# Patient Record
Sex: Male | Born: 1990 | Race: White | Hispanic: No | Marital: Married | Smoking: Never smoker
Health system: Southern US, Community
[De-identification: ages and names within clinical notes are randomized; demographics above are authoritative.]

---

## 2014-01-14 ENCOUNTER — Ambulatory Visit (INDEPENDENT_AMBULATORY_CARE_PROVIDER_SITE_OTHER): Payer: PRIVATE HEALTH INSURANCE | Admitting: Urology

## 2014-01-14 DIAGNOSIS — R3129 Other microscopic hematuria: Secondary | ICD-10-CM

## 2014-04-15 ENCOUNTER — Ambulatory Visit: Payer: PRIVATE HEALTH INSURANCE | Admitting: Urology

## 2014-08-03 ENCOUNTER — Telehealth: Payer: Self-pay | Admitting: Family Medicine

## 2014-08-03 NOTE — Telephone Encounter (Signed)
Patient is not on any medications- [facility] appt give for 10/2

## 2014-08-26 ENCOUNTER — Ambulatory Visit: Payer: Self-pay | Admitting: Family Medicine

## 2014-10-14 ENCOUNTER — Encounter: Payer: Self-pay | Admitting: Family Medicine

## 2014-10-14 ENCOUNTER — Encounter (INDEPENDENT_AMBULATORY_CARE_PROVIDER_SITE_OTHER): Payer: Self-pay

## 2014-10-14 ENCOUNTER — Ambulatory Visit (INDEPENDENT_AMBULATORY_CARE_PROVIDER_SITE_OTHER): Payer: 59 | Admitting: Family Medicine

## 2014-10-14 VITALS — BP 99/66 | HR 63 | Temp 98.1°F | Ht 67.5 in | Wt 215.6 lb

## 2014-10-14 DIAGNOSIS — Z0000 Encounter for general adult medical examination without abnormal findings: Secondary | ICD-10-CM

## 2014-10-14 NOTE — Progress Notes (Signed)
   Subjective:    Patient ID: Joel Bridges, male    DOB: 07/10/1991, 23 y.o.   MRN: [id number]  HPI 23 year old young man here for a physical at the request of his health insurance company. He has no problems issues. Family history is negative for chronic disease.    Review of Systems  Constitutional: Negative.   HENT: Negative.   Eyes: Negative.   Respiratory: Negative.  Negative for shortness of breath.   Cardiovascular: Negative.  Negative for chest pain and leg swelling.  Gastrointestinal: Negative.   Genitourinary: Negative.   Musculoskeletal: Negative.   Skin: Negative.   Neurological: Negative.   Psychiatric/Behavioral: Negative.   All other systems reviewed and are negative.      Objective:   Physical Exam  Constitutional: He is oriented to person, place, and time. He appears well-developed and well-nourished.  HENT:  Head: Normocephalic.  Right Ear: External ear normal.  Left Ear: External ear normal.  Nose: Nose normal.  Mouth/Throat: Oropharynx is clear and moist.  Eyes: Conjunctivae and EOM are normal. Pupils are equal, round, and reactive to light.  Neck: Normal range of motion. Neck supple.  Cardiovascular: Normal rate, regular rhythm, normal heart sounds and intact distal pulses.   Pulmonary/Chest: Effort normal and breath sounds normal.  Abdominal: Soft. Bowel sounds are normal.  Musculoskeletal: Normal range of motion.  Neurological: He is alert and oriented to person, place, and time.  Skin: Skin is warm and dry.  Psychiatric: He has a normal mood and affect. His behavior is normal. Judgment and thought content normal.    BP 99/66 mmHg  Pulse 63  Temp(Src) 98.1 F (36.7 C) (Oral)  Ht 5' 7.5" (1.715 m)  Wt 215 lb 9.6 oz (97.796 kg)  BMI 33.25 kg/m2      Assessment & Plan:  1. Routine general medical examination at a health care facility Normal exam but cautioned re waist circumference at his age - BMP8+EGFR  Wardell Honour MD - Lipid  panel

## 2014-10-14 NOTE — Patient Instructions (Signed)

## 2014-10-15 LAB — LIPID PANEL
Chol/HDL Ratio: 3.4 ratio units (ref 0.0–5.0)
Cholesterol, Total: 172 mg/dL (ref 100–199)
HDL: 51 mg/dL (ref >39)
LDL Calculated: 94 mg/dL (ref 0–99)
Triglycerides: 137 mg/dL (ref 0–149)
VLDL Cholesterol Cal: 27 mg/dL (ref 5–40)

## 2014-10-15 LAB — BMP8+EGFR
BUN/Creatinine Ratio: 18 (ref 8–19)
BUN: 15 mg/dL (ref 6–20)
CO2: 24 mmol/L (ref 18–29)
Calcium: 9.8 mg/dL (ref 8.7–10.2)
Chloride: 99 mmol/L (ref 97–108)
Creatinine, Ser: 0.84 mg/dL (ref 0.76–1.27)
GFR calc Af Amer: 143 mL/min/{1.73_m2} (ref >59)
GFR calc non Af Amer: 123 mL/min/{1.73_m2} (ref >59)
Glucose: 85 mg/dL (ref 65–99)
Potassium: 4.5 mmol/L (ref 3.5–5.2)
Sodium: 139 mmol/L (ref 134–144)

## 2014-10-17 ENCOUNTER — Telehealth: Payer: Self-pay | Admitting: Family Medicine

## 2014-10-17 NOTE — Telephone Encounter (Signed)
-----   Message from Frederica KusterStephen M Miller, MD sent at 10/17/2014  7:55 AM EST ----- Blood chemistries and lipids are all normal

## 2014-10-17 NOTE — Telephone Encounter (Signed)
Letter sent with results; No vm available 

## 2015-09-08 ENCOUNTER — Encounter: Payer: Self-pay | Admitting: Family Medicine

## 2015-09-08 ENCOUNTER — Ambulatory Visit (INDEPENDENT_AMBULATORY_CARE_PROVIDER_SITE_OTHER): Payer: BLUE CROSS/BLUE SHIELD | Admitting: Family Medicine

## 2015-09-08 VITALS — BP 113/78 | HR 96 | Temp 97.3°F | Ht 67.5 in | Wt 235.2 lb

## 2015-09-08 DIAGNOSIS — Z0000 Encounter for general adult medical examination without abnormal findings: Secondary | ICD-10-CM

## 2015-09-08 NOTE — Progress Notes (Signed)
Subjective:  Patient ID: Fredrik Rigger, male    DOB: 02-13-1991  Age: 24 y.o. MRN: [id number]  CC: Annual Exam   HPI Tahj Njoku presents for CPE   History Harout has no past medical history on file.   He has no past surgical history on file.   His family history is not on file.He reports that he has never smoked. He does not have any smokeless tobacco history on file. He reports that he does not drink alcohol or use illicit drugs.  No outpatient prescriptions prior to visit.   No facility-administered medications prior to visit.    ROS Review of Systems  Constitutional: Negative for fever, chills and diaphoresis.  HENT: Negative for congestion, rhinorrhea and sore throat.   Respiratory: Negative for cough, shortness of breath and wheezing.   Cardiovascular: Negative for chest pain.  Gastrointestinal: Negative for nausea, vomiting, abdominal pain, diarrhea, constipation and abdominal distention.  Genitourinary: Negative for dysuria and frequency.  Musculoskeletal: Negative for joint swelling and arthralgias.  Skin: Negative for rash.  Neurological: Negative for headaches.    Objective:  BP 113/78 mmHg  Pulse 96  Temp(Src) 97.3 F (36.3 C) (Oral)  Ht 5' 7.5" (1.715 m)  Wt 235 lb 3.2 oz (106.686 kg)  BMI 36.27 kg/m2  BP Readings from Last 3 Encounters:  09/08/15 113/78  10/14/14 99/66    Wt Readings from Last 3 Encounters:  09/08/15 235 lb 3.2 oz (106.686 kg)  10/14/14 215 lb 9.6 oz (97.796 kg)     Physical Exam  Constitutional: He is oriented to person, place, and time. He appears well-developed and well-nourished. No distress.  HENT:  Head: Normocephalic and atraumatic.  Right Ear: External ear normal.  Left Ear: External ear normal.  Nose: Nose normal.  Mouth/Throat: Oropharynx is clear and moist.  Eyes: Conjunctivae and EOM are normal. Pupils are equal, round, and reactive to light.  Neck: Normal range of motion. Neck supple. No thyromegaly  present.  Cardiovascular: Normal rate, regular rhythm and normal heart sounds.   No murmur heard. Pulmonary/Chest: Effort normal and breath sounds normal. No respiratory distress. He has no wheezes. He has no rales.  Abdominal: Soft. Bowel sounds are normal. He exhibits no distension. There is no tenderness.  Lymphadenopathy:    He has no cervical adenopathy.  Neurological: He is alert and oriented to person, place, and time. He has normal reflexes.  Skin: Skin is warm and dry.  Psychiatric: He has a normal mood and affect. His behavior is normal. Judgment and thought content normal.    No results found for: HGBA1C  Lab Results  Component Value Date   GLUCOSE 85 10/14/2014   CHOL 172 10/14/2014   TRIG 137 10/14/2014   HDL 51 10/14/2014   LDLCALC 94 10/14/2014   NA 139 10/14/2014   K 4.5 10/14/2014   CL 99 10/14/2014   CREATININE 0.84 10/14/2014   BUN 15 10/14/2014   CO2 24 10/14/2014    Patient was never admitted.  Assessment & Plan:   Jhett was seen today for annual exam.  Diagnoses and all orders for this visit:  Routine general medical examination at a health care facility -     CBC with Differential/Platelet -     CMP14+EGFR -     Lipid panel -     TSH -     Vit D  25 hydroxy (rtn osteoporosis monitoring)   Mr. Ricketson does not currently have medications on file.  No orders of  the defined types were placed in this encounter.   Patient was counseled on appropriate diet. Low carbohydrate, low sodium, high protein approach. WEight loss to goal of 170 lb. Regular exercise benefit with mix of cardio and resistance training was reviewed. Reminded to wear seat belt when driving or a passenger. Alcohol and drug avoidance reviewed. Safe sex discussed. Follow-up: No Follow-up on file.  Claretta Fraise, M.D.

## 2015-09-09 ENCOUNTER — Other Ambulatory Visit: Payer: BLUE CROSS/BLUE SHIELD

## 2015-09-11 ENCOUNTER — Other Ambulatory Visit: Payer: Self-pay | Admitting: Family Medicine

## 2015-09-11 LAB — CMP14+EGFR
ALT: 23 IU/L (ref 0–44)
AST: 17 IU/L (ref 0–40)
Albumin/Globulin Ratio: 2 (ref 1.1–2.5)
Albumin: 4.7 g/dL (ref 3.5–5.5)
Alkaline Phosphatase: 55 IU/L (ref 39–117)
BUN/Creatinine Ratio: 18 (ref 8–19)
BUN: 16 mg/dL (ref 6–20)
Bilirubin Total: 0.3 mg/dL (ref 0.0–1.2)
CO2: 21 mmol/L (ref 18–29)
Calcium: 9.6 mg/dL (ref 8.7–10.2)
Chloride: 99 mmol/L (ref 97–108)
Creatinine, Ser: 0.87 mg/dL (ref 0.76–1.27)
GFR calc Af Amer: 140 mL/min/{1.73_m2} (ref >59)
GFR calc non Af Amer: 121 mL/min/{1.73_m2} (ref >59)
Globulin, Total: 2.4 g/dL (ref 1.5–4.5)
Glucose: 86 mg/dL (ref 65–99)
Potassium: 4.8 mmol/L (ref 3.5–5.2)
Sodium: 140 mmol/L (ref 134–144)
Total Protein: 7.1 g/dL (ref 6.0–8.5)

## 2015-09-11 LAB — CBC WITH DIFFERENTIAL/PLATELET
Basophils Absolute: 0 10*3/uL (ref 0.0–0.2)
Basos: 1 %
EOS (ABSOLUTE): 0.5 10*3/uL — ABNORMAL HIGH (ref 0.0–0.4)
Eos: 7 %
Hematocrit: 44.1 % (ref 37.5–51.0)
Hemoglobin: 14.5 g/dL (ref 12.6–17.7)
Immature Grans (Abs): 0 10*3/uL (ref 0.0–0.1)
Immature Granulocytes: 0 %
Lymphocytes Absolute: 2.4 10*3/uL (ref 0.7–3.1)
Lymphs: 35 %
MCH: 29.5 pg (ref 26.6–33.0)
MCHC: 32.9 g/dL (ref 31.5–35.7)
MCV: 90 fL (ref 79–97)
Monocytes Absolute: 0.5 10*3/uL (ref 0.1–0.9)
Monocytes: 7 %
Neutrophils Absolute: 3.5 10*3/uL (ref 1.4–7.0)
Neutrophils: 50 %
Platelets: 333 10*3/uL (ref 150–379)
RBC: 4.92 x10E6/uL (ref 4.14–5.80)
RDW: 12.4 % (ref 12.3–15.4)
WBC: 7 10*3/uL (ref 3.4–10.8)

## 2015-09-11 LAB — TSH: TSH: 1.5 u[IU]/mL (ref 0.450–4.500)

## 2015-09-11 LAB — LIPID PANEL
Chol/HDL Ratio: 3.8 ratio units (ref 0.0–5.0)
Cholesterol, Total: 212 mg/dL — ABNORMAL HIGH (ref 100–199)
HDL: 56 mg/dL (ref >39)
LDL Calculated: 122 mg/dL — ABNORMAL HIGH (ref 0–99)
Triglycerides: 171 mg/dL — ABNORMAL HIGH (ref 0–149)
VLDL Cholesterol Cal: 34 mg/dL (ref 5–40)

## 2015-09-11 LAB — VITAMIN D 25 HYDROXY (VIT D DEFICIENCY, FRACTURES): Vit D, 25-Hydroxy: 21.5 ng/mL — ABNORMAL LOW (ref 30.0–100.0)

## 2015-09-11 MED ORDER — VITAMIN D (ERGOCALCIFEROL) 1.25 MG (50000 UNIT) PO CAPS
50000.0000 [IU] | ORAL_CAPSULE | ORAL | Status: DC
Start: 2015-09-11 — End: 2016-09-13

## 2016-09-13 ENCOUNTER — Ambulatory Visit (INDEPENDENT_AMBULATORY_CARE_PROVIDER_SITE_OTHER): Payer: BLUE CROSS/BLUE SHIELD | Admitting: Pediatrics

## 2016-09-13 ENCOUNTER — Encounter: Payer: Self-pay | Admitting: Pediatrics

## 2016-09-13 VITALS — BP 121/84 | HR 80 | Temp 98.4°F | Ht 67.5 in | Wt 230.4 lb

## 2016-09-13 DIAGNOSIS — S4992XA Unspecified injury of left shoulder and upper arm, initial encounter: Secondary | ICD-10-CM

## 2016-09-13 NOTE — Progress Notes (Signed)
  Subjective:   Patient ID: Joel Bridges, male    DOB: 10-31-1991, 25 y.o.   MRN: [id number] CC: numbness in left arm  HPI: Joel FloorRandall Brunn is a 25 y.o. male presenting for numbness in left arm  Was welding pipe three days ago Felt something give in his shoulder Now not able to raise L arm completely over his head Not able to make a biceps muscle with that arm Works as a Psychologist, occupationalwelder Some discomfort, not much pain Normal sensation hands, forearms Happened 2 days ago at work Had a harder time using L arm after that, minimal pain at time of injury  Relevant past medical, surgical, family and social history reviewed. Allergies and medications reviewed and updated. History  Smoking Status  . Never Smoker  Smokeless Tobacco  . Never Used   ROS: Per HPI   Objective:    BP (!) 146/89   Pulse 80   Temp 98.4 F (36.9 C) (Oral)   Ht 5' 7.5" (1.715 m)   Wt 230 lb 6.4 oz (104.5 kg)   BMI 35.55 kg/m   Wt Readings from Last 3 Encounters:  09/13/16 230 lb 6.4 oz (104.5 kg)  09/08/15 235 lb 3.2 oz (106.7 kg)  10/14/14 215 lb 9.6 oz (97.8 kg)    Gen: NAD, alert, cooperative with exam, NCAT EYES: EOMI, no conjunctival injection, or no icterus Ext: No edema, warm Neuro: Alert and oriented, sensation intact throughout UE MSK: Shoulder: Inspection reveals no abnormalities, atrophy or asymmetry. Feels slightly uncomfortable with palpation anterior shoulder, no tenderness over AC joint or bicipital groove. Can raise arm to apprx 120 degrees overhead, limited by weakness, no pain with passive ROM Weak elbow flex L side (4/5) compared with R Not able to tighten L biceps muscle fully Weak supraspinatus muscle L side (3/5) compared with R (5/5), strength gives way with minimal resistance when holding arm straight forward thumb up Stronger with thumb down L side (4/5), R side normal 5/5   Assessment & Plan:  Brynda GreathouseRandall was seen today for L arm injury, weakness. Concern for biceps tendon tear vs  rotator cuff muscle complete tear. Minimal pain now.   Diagnoses and all orders for this visit:  Injury of left shoulder, initial encounter -     Ambulatory referral to Orthopedic Surgery   Follow up plan: prn Rex Krasarol Vincent, MD [facility]

## 2016-09-25 ENCOUNTER — Ambulatory Visit (HOSPITAL_COMMUNITY)
Admission: RE | Admit: 2016-09-25 | Discharge: 2016-09-25 | Disposition: A | Discharge status: Home / Self Care | Payer: BLUE CROSS/BLUE SHIELD | Source: Ambulatory Visit | Attending: Orthopedic Surgery | Admitting: Orthopedic Surgery

## 2016-09-25 ENCOUNTER — Other Ambulatory Visit (HOSPITAL_COMMUNITY): Payer: Self-pay | Admitting: Orthopedic Surgery

## 2016-09-25 DIAGNOSIS — Z1389 Encounter for screening for other disorder: Secondary | ICD-10-CM | POA: Diagnosis present

## 2016-12-27 ENCOUNTER — Encounter: Payer: BLUE CROSS/BLUE SHIELD | Admitting: Physician Assistant

## 2016-12-27 ENCOUNTER — Ambulatory Visit (INDEPENDENT_AMBULATORY_CARE_PROVIDER_SITE_OTHER): Payer: BLUE CROSS/BLUE SHIELD | Admitting: Physician Assistant

## 2016-12-27 ENCOUNTER — Encounter: Payer: Self-pay | Admitting: Physician Assistant

## 2016-12-27 VITALS — BP 121/75 | HR 77 | Temp 97.5°F | Ht 67.5 in | Wt 234.6 lb

## 2016-12-27 DIAGNOSIS — Z23 Encounter for immunization: Secondary | ICD-10-CM | POA: Diagnosis not present

## 2016-12-27 DIAGNOSIS — Z0000 Encounter for general adult medical examination without abnormal findings: Secondary | ICD-10-CM

## 2016-12-27 NOTE — Patient Instructions (Signed)

## 2016-12-28 LAB — CBC WITH DIFFERENTIAL/PLATELET
Basophils Absolute: 0.1 10*3/uL (ref 0.0–0.2)
Basos: 1 %
EOS (ABSOLUTE): 0.5 10*3/uL — ABNORMAL HIGH (ref 0.0–0.4)
Eos: 5 %
Hematocrit: 41.4 % (ref 37.5–51.0)
Hemoglobin: 13.6 g/dL (ref 13.0–17.7)
Immature Grans (Abs): 0 10*3/uL (ref 0.0–0.1)
Immature Granulocytes: 0 %
Lymphocytes Absolute: 2.5 10*3/uL (ref 0.7–3.1)
Lymphs: 25 %
MCH: 28.9 pg (ref 26.6–33.0)
MCHC: 32.9 g/dL (ref 31.5–35.7)
MCV: 88 fL (ref 79–97)
Monocytes Absolute: 0.8 10*3/uL (ref 0.1–0.9)
Monocytes: 8 %
Neutrophils Absolute: 6.1 10*3/uL (ref 1.4–7.0)
Neutrophils: 61 %
Platelets: 278 10*3/uL (ref 150–379)
RBC: 4.7 x10E6/uL (ref 4.14–5.80)
RDW: 12.5 % (ref 12.3–15.4)
WBC: 9.9 10*3/uL (ref 3.4–10.8)

## 2016-12-28 LAB — LIPID PANEL
Chol/HDL Ratio: 4.3 ratio units (ref 0.0–5.0)
Cholesterol, Total: 207 mg/dL — ABNORMAL HIGH (ref 100–199)
HDL: 48 mg/dL (ref >39)
LDL Calculated: 138 mg/dL — ABNORMAL HIGH (ref 0–99)
Triglycerides: 103 mg/dL (ref 0–149)
VLDL Cholesterol Cal: 21 mg/dL (ref 5–40)

## 2016-12-28 LAB — CMP14+EGFR
ALT: 63 IU/L — ABNORMAL HIGH (ref 0–44)
AST: 33 IU/L (ref 0–40)
Albumin/Globulin Ratio: 2 (ref 1.2–2.2)
Albumin: 4.8 g/dL (ref 3.5–5.5)
Alkaline Phosphatase: 62 IU/L (ref 39–117)
BUN/Creatinine Ratio: 12 (ref 9–20)
BUN: 11 mg/dL (ref 6–20)
Bilirubin Total: 0.3 mg/dL (ref 0.0–1.2)
CO2: 22 mmol/L (ref 18–29)
Calcium: 9.3 mg/dL (ref 8.7–10.2)
Chloride: 99 mmol/L (ref 96–106)
Creatinine, Ser: 0.89 mg/dL (ref 0.76–1.27)
GFR calc Af Amer: 137 mL/min/{1.73_m2} (ref >59)
GFR calc non Af Amer: 119 mL/min/{1.73_m2} (ref >59)
Globulin, Total: 2.4 g/dL (ref 1.5–4.5)
Glucose: 90 mg/dL (ref 65–99)
Potassium: 4.3 mmol/L (ref 3.5–5.2)
Sodium: 140 mmol/L (ref 134–144)
Total Protein: 7.2 g/dL (ref 6.0–8.5)

## 2016-12-29 NOTE — Progress Notes (Signed)
BP 121/75   Pulse 77   Temp 97.5 F (36.4 C) (Oral)   Ht 5' 7.5" (1.715 m)   Wt 234 lb 9.6 oz (106.4 kg)   BMI 36.20 kg/m    Subjective:    Patient ID: Joel Bridges, male    DOB: [birth year], 26 y.o.   MRN: [id number]  HPI: Joel Bridges is a 26 y.o. male presenting on 12/27/2016 for Annual Exam  This patient comes in for annual well physical examination. All medications are reviewed today. There are no reports of any problems with the medications. All of the medical conditions are reviewed and updated.  Lab work is reviewed and will be ordered as medically necessary. There are no new problems reported with today's visit.  Patient reports doing well overall.  History reviewed. No pertinent past medical history. Relevant past medical, surgical, family and social history reviewed and updated as indicated. Interim medical history since our last visit reviewed. Allergies and medications reviewed and updated. DATA REVIEWED: CHART IN EPIC  Social History   Social History  . Marital status: Married    Spouse name: N/A  . Number of children: N/A  . Years of education: N/A   Occupational History  . Not on file.   Social History Main Topics  . Smoking status: Never Smoker  . Smokeless tobacco: Never Used  . Alcohol use No  . Drug use: No  . Sexual activity: Not on file   Other Topics Concern  . Not on file   Social History Narrative  . No narrative on file    History reviewed. No pertinent surgical history.  History reviewed. No pertinent family history.  Review of Systems  Constitutional: Negative.  Negative for appetite change and fatigue.  HENT: Negative.   Eyes: Negative.  Negative for pain and visual disturbance.  Respiratory: Negative.  Negative for cough, chest tightness, shortness of breath and wheezing.   Cardiovascular: Negative.  Negative for chest pain, palpitations and leg swelling.  Gastrointestinal: Negative.  Negative for abdominal pain, diarrhea,  nausea and vomiting.  Endocrine: Negative.   Genitourinary: Negative.   Musculoskeletal: Negative.   Skin: Negative.  Negative for color change and rash.  Neurological: Negative.  Negative for weakness, numbness and headaches.  Psychiatric/Behavioral: Negative.     Allergies as of 12/27/2016   No Known Allergies     Medication List    as of 12/27/2016 11:59 PM   You have not been prescribed any medications.        Objective:    BP 121/75   Pulse 77   Temp 97.5 F (36.4 C) (Oral)   Ht 5' 7.5" (1.715 m)   Wt 234 lb 9.6 oz (106.4 kg)   BMI 36.20 kg/m   No Known Allergies  Wt Readings from Last 3 Encounters:  12/27/16 234 lb 9.6 oz (106.4 kg)  09/13/16 230 lb 6.4 oz (104.5 kg)  09/08/15 235 lb 3.2 oz (106.7 kg)    Physical Exam  Constitutional: He appears well-developed and well-nourished.  HENT:  Head: Normocephalic and atraumatic.  Eyes: Conjunctivae and EOM are normal. Pupils are equal, round, and reactive to light.  Neck: Normal range of motion. Neck supple.  Cardiovascular: Normal rate, regular rhythm and normal heart sounds.   Pulmonary/Chest: Effort normal and breath sounds normal.  Abdominal: Soft. Bowel sounds are normal.  Musculoskeletal: Normal range of motion.  Skin: Skin is warm and dry.    Results for orders placed or performed in visit on  12/27/16  CMP14+EGFR  Result Value Ref Range   Glucose 90 65 - 99 mg/dL   BUN 11 6 - 20 mg/dL   Creatinine, Ser 0.89 0.76 - 1.27 mg/dL   GFR calc non Af Amer 119 >59 mL/min/1.73   GFR calc Af Amer 137 >59 mL/min/1.73   BUN/Creatinine Ratio 12 9 - 20   Sodium 140 134 - 144 mmol/L   Potassium 4.3 3.5 - 5.2 mmol/L   Chloride 99 96 - 106 mmol/L   CO2 22 18 - 29 mmol/L   Calcium 9.3 8.7 - 10.2 mg/dL   Total Protein 7.2 6.0 - 8.5 g/dL   Albumin 4.8 3.5 - 5.5 g/dL   Globulin, Total 2.4 1.5 - 4.5 g/dL   Albumin/Globulin Ratio 2.0 1.2 - 2.2   Bilirubin Total 0.3 0.0 - 1.2 mg/dL   Alkaline Phosphatase 62 39 - 117  IU/L   AST 33 0 - 40 IU/L   ALT 63 (H) 0 - 44 IU/L  Lipid panel  Result Value Ref Range   Cholesterol, Total 207 (H) 100 - 199 mg/dL   Triglycerides 103 0 - 149 mg/dL   HDL 48 >39 mg/dL   VLDL Cholesterol Cal 21 5 - 40 mg/dL   LDL Calculated 138 (H) 0 - 99 mg/dL   Chol/HDL Ratio 4.3 0.0 - 5.0 ratio units  CBC with Differential/Platelet  Result Value Ref Range   WBC 9.9 3.4 - 10.8 x10E3/uL   RBC 4.70 4.14 - 5.80 x10E6/uL   Hemoglobin 13.6 13.0 - 17.7 g/dL   Hematocrit 41.4 37.5 - 51.0 %   MCV 88 79 - 97 fL   MCH 28.9 26.6 - 33.0 pg   MCHC 32.9 31.5 - 35.7 g/dL   RDW 12.5 12.3 - 15.4 %   Platelets 278 150 - 379 x10E3/uL   Neutrophils 61 Not Estab. %   Lymphs 25 Not Estab. %   Monocytes 8 Not Estab. %   Eos 5 Not Estab. %   Basos 1 Not Estab. %   Neutrophils Absolute 6.1 1.4 - 7.0 x10E3/uL   Lymphocytes Absolute 2.5 0.7 - 3.1 x10E3/uL   Monocytes Absolute 0.8 0.1 - 0.9 x10E3/uL   EOS (ABSOLUTE) 0.5 (H) 0.0 - 0.4 x10E3/uL   Basophils Absolute 0.1 0.0 - 0.2 x10E3/uL   Immature Granulocytes 0 Not Estab. %   Immature Grans (Abs) 0.0 0.0 - 0.1 x10E3/uL      Assessment & Plan:   1. Well adult exam - CMP14+EGFR - Lipid panel - CBC with Differential/Platelet   Continue all other maintenance medications as listed above.  Follow up plan: Return if symptoms worsen or fail to improve.  Orders Placed This Encounter  Procedures  . Tdap vaccine greater than or equal to 7yo IM  . CMP14+EGFR  . Lipid panel  . CBC with Differential/Platelet    Educational handout given for health maintenance  Terald Sleeper PA-C [facility] [address]  [place], [place] [address] [phone]   12/29/2016, 9:49 PM

## 2016-12-30 ENCOUNTER — Encounter: Payer: BLUE CROSS/BLUE SHIELD | Admitting: Physician Assistant

## 2017-04-11 IMAGING — CR DG ORBITS FOR FOREIGN BODY
2 series · 2 of 2 positions shown · non-contrast
Comparison: None.

CLINICAL DATA: Metal working/exposure; clearance prior to MRI

EXAM:
ORBITS FOR FOREIGN BODY - 2 VIEW

[w waters pa (1 of 2)]
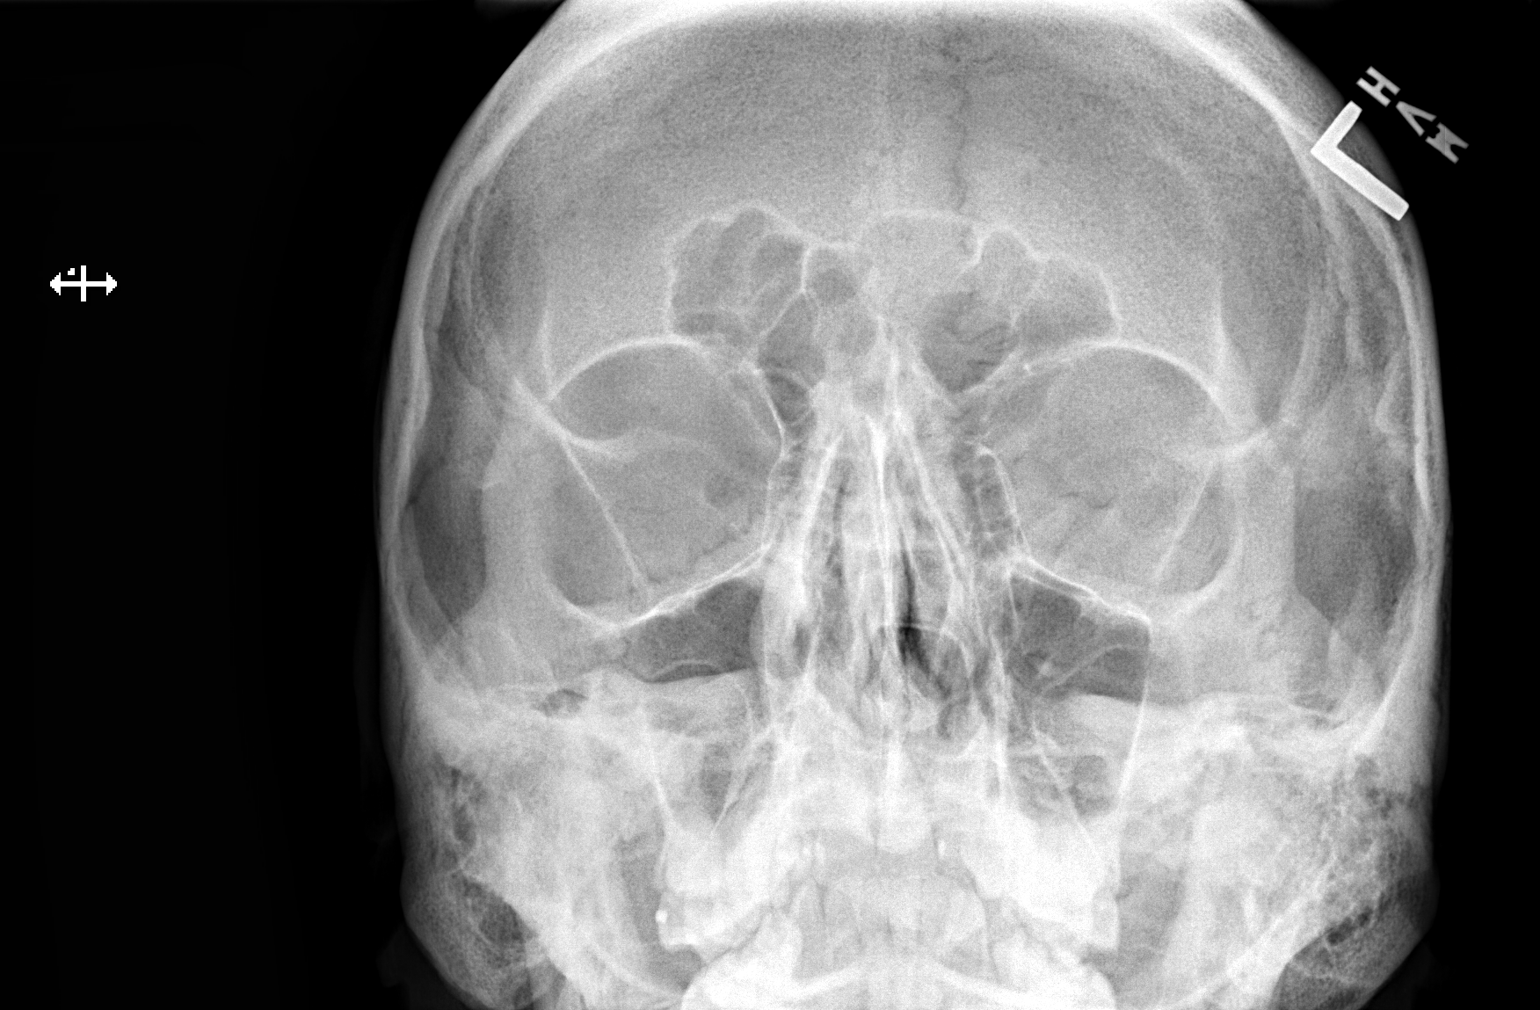

[w waters pa (2 of 2)]
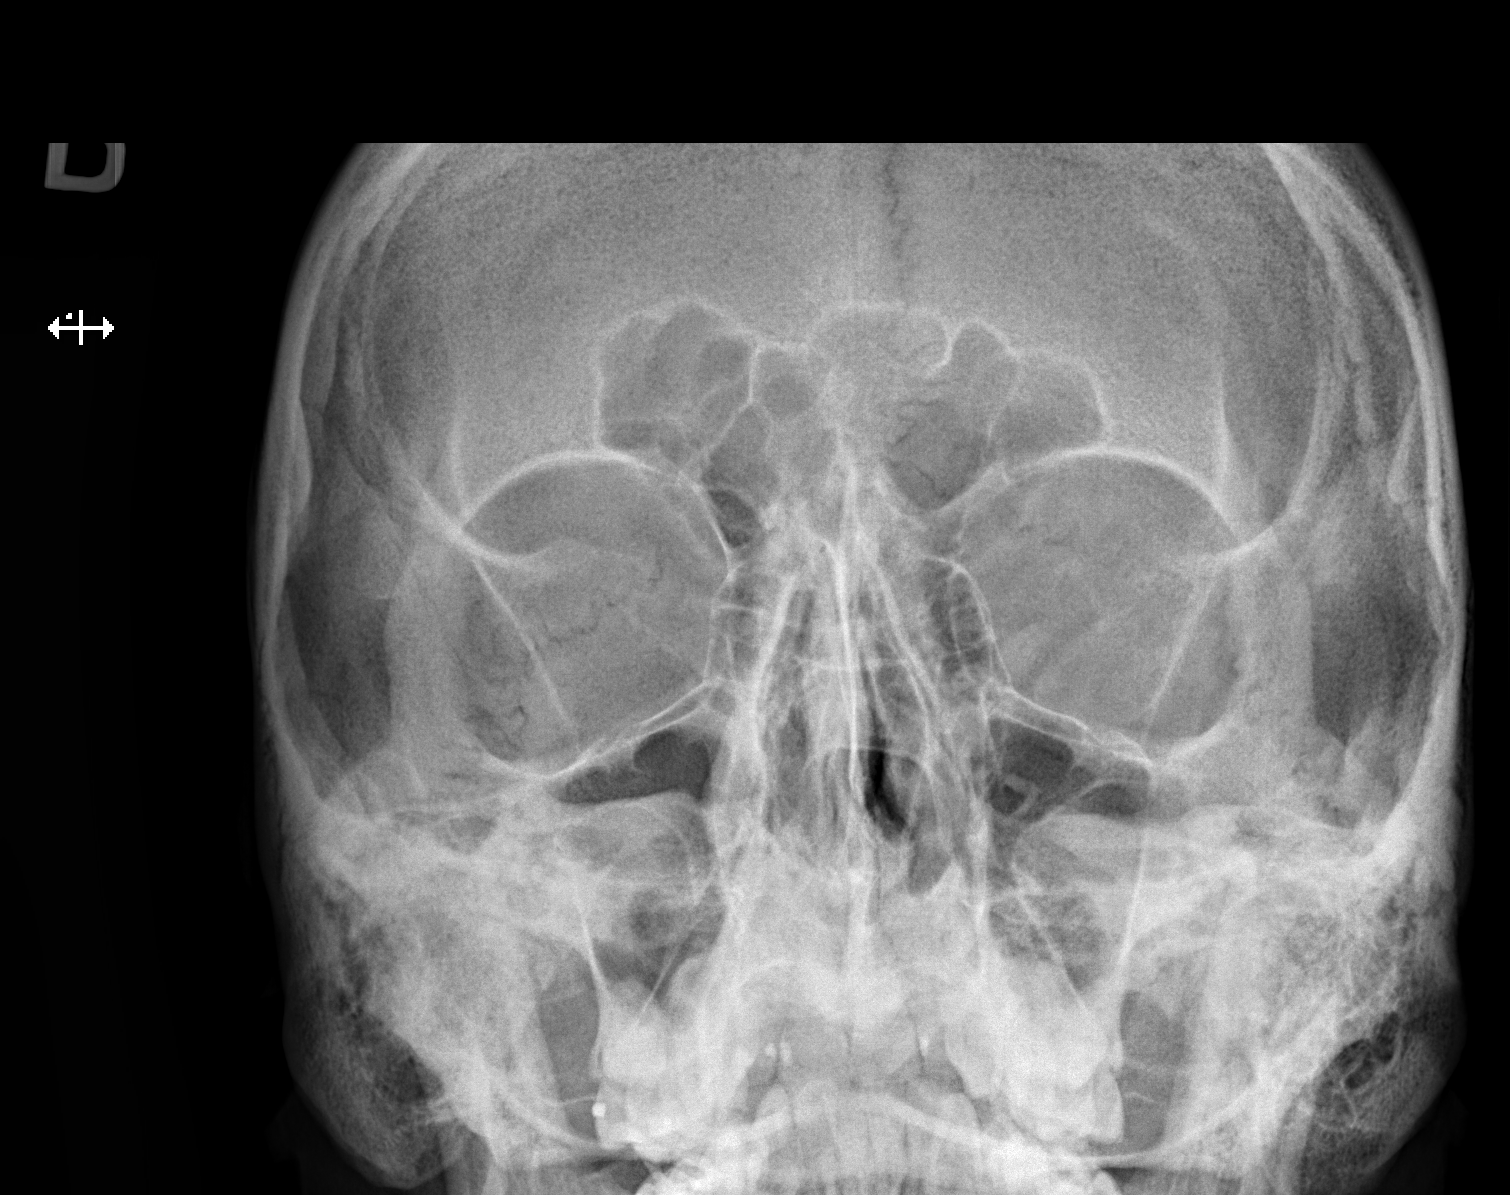

[2 of 2 positions shown; findings below may reference images not displayed]

FINDINGS: There is no evidence of metallic foreign body within the orbits. No
significant bone abnormality identified. The observed paranasal
sinuses are clear.
IMPRESSION: No evidence of metallic foreign body within the orbits.

## 2017-05-19 ENCOUNTER — Ambulatory Visit (INDEPENDENT_AMBULATORY_CARE_PROVIDER_SITE_OTHER): Payer: BLUE CROSS/BLUE SHIELD | Admitting: Family Medicine

## 2017-05-19 ENCOUNTER — Encounter: Payer: Self-pay | Admitting: Family Medicine

## 2017-05-19 VITALS — BP 137/86 | HR 114 | Temp 99.4°F | Ht 67.5 in | Wt 225.2 lb

## 2017-05-19 DIAGNOSIS — R509 Fever, unspecified: Secondary | ICD-10-CM

## 2017-05-19 MED ORDER — DOXYCYCLINE HYCLATE 100 MG PO TABS: 100.0000 mg | ORAL_TABLET | Freq: Two times a day (BID) | ORAL | 0 refills | Status: DC

## 2017-05-19 NOTE — Patient Instructions (Signed)
Great to see you!  Be on the lookout for development of a rash. Take tylenol as needed for fever

## 2017-05-19 NOTE — Progress Notes (Signed)
   HPI  Patient presents today here for fever.  Patient's planes he's had fever, chills, sweats, body aches, and muscle aches for about 2 days. He had a fever measured 101 last night. The rash, sore throat, cough, shortness of breath, abdominal pain, vomiting, or diarrhea.  Patient has had a tick bite about 3-4 weeks ago. He has no sick exposure. They have a 3357-month-old child at home.  PMH: Smoking status noted ROS: Per HPI  Objective: BP 137/86   Pulse (!) 114   Temp 99.4 F (37.4 C) (Oral)   Ht 5' 7.5" (1.715 m)   Wt 225 lb 3.2 oz (102.2 kg)   BMI 34.75 kg/m  Gen: NAD, alert, cooperative with exam HEENT: NCAT, oropharynx clear, nares clear, TMs normal bilaterally CV: RRR, good S1/S2, no murmur Resp: CTABL, no wheezes, non-labored Abd: SNTND, BS present, no guarding or organomegaly Ext: No edema, warm Neuro: Alert and oriented, No gross deficits  Assessment and plan:  #Fever Unclear etiology, flulike illness outside of flu season, with recent tick bite in many recent cases of RMSF and Lyme disease in our community I have chosen to go ahead and treat him for doxycycline. We discussed that if this is a viral illness he will likely improve in 4-5 days regardless of medication. I would expect a more rapid response to doxycycline if it is tick borne illness. Low threshold for follow-up Offered extensive testing including rapid flu, tickborne titers, labs, however finances are tight and they have a deductible to meet so they would like to minimize testing if possible.    Meds ordered this encounter  Medications  . doxycycline (VIBRA-TABS) 100 MG tablet    Sig: Take 1 tablet (100 mg total) by mouth 2 (two) times daily. 1 po bid    Dispense:  28 tablet    Refill:  0    Murtis SinkSam Bradshaw, MD [facility] 05/19/2017, 10:22 AM

## 2018-03-06 ENCOUNTER — Encounter: Payer: Self-pay | Admitting: Physician Assistant

## 2018-03-06 ENCOUNTER — Ambulatory Visit (INDEPENDENT_AMBULATORY_CARE_PROVIDER_SITE_OTHER): Payer: BLUE CROSS/BLUE SHIELD | Admitting: Physician Assistant

## 2018-03-06 VITALS — BP 125/76 | HR 67 | Temp 97.6°F | Ht 67.5 in | Wt 214.0 lb

## 2018-03-06 DIAGNOSIS — Z0000 Encounter for general adult medical examination without abnormal findings: Secondary | ICD-10-CM | POA: Diagnosis not present

## 2018-03-06 NOTE — Progress Notes (Signed)
   BP 125/76   Pulse 67   Temp 97.6 F (36.4 C) (Oral)   Ht 5' 7.5" (1.715 m)   Wt 214 lb (97.1 kg)   BMI 33.02 kg/m    Subjective:    Patient ID: Joel Bridges, male    DOB: 1990-12-04, 27 y.o.   MRN: [id number]  HPI: Joel Bridges is a 27 y.o. male presenting on 03/06/2018 for Annual Exam  This patient comes in for annual well physical examination. All medications are reviewed today. There are no reports of any problems with the medications. All of the medical conditions are reviewed and updated.  Lab work is reviewed and will be ordered as medically necessary. There are no new problems reported with today's visit.  Patient reports doing well overall.   History reviewed. No pertinent past medical history. Relevant past medical, surgical, family and social history reviewed and updated as indicated. Interim medical history since our last visit reviewed. Allergies and medications reviewed and updated. DATA REVIEWED: CHART IN EPIC  Family History reviewed for pertinent findings.  Review of Systems  Constitutional: Negative.  Negative for appetite change and fatigue.  HENT: Negative.   Eyes: Negative.  Negative for pain and visual disturbance.  Respiratory: Negative.  Negative for cough, chest tightness, shortness of breath and wheezing.   Cardiovascular: Negative.  Negative for chest pain, palpitations and leg swelling.  Gastrointestinal: Negative.  Negative for abdominal pain, diarrhea, nausea and vomiting.  Endocrine: Negative.   Genitourinary: Negative.   Musculoskeletal: Negative.   Skin: Negative.  Negative for color change and rash.  Neurological: Negative.  Negative for weakness, numbness and headaches.  Psychiatric/Behavioral: Negative.     Allergies as of 03/06/2018   No Known Allergies     Medication List    as of 03/06/2018  3:56 PM   You have not been prescribed any medications.        Objective:    BP 125/76   Pulse 67   Temp 97.6 F (36.4 C) (Oral)    Ht 5' 7.5" (1.715 m)   Wt 214 lb (97.1 kg)   BMI 33.02 kg/m   No Known Allergies  Wt Readings from Last 3 Encounters:  03/06/18 214 lb (97.1 kg)  05/19/17 225 lb 3.2 oz (102.2 kg)  12/27/16 234 lb 9.6 oz (106.4 kg)    Physical Exam  Constitutional: He appears well-developed and well-nourished.  HENT:  Head: Normocephalic and atraumatic.  Eyes: Pupils are equal, round, and reactive to light. Conjunctivae and EOM are normal.  Neck: Normal range of motion. Neck supple.  Cardiovascular: Normal rate, regular rhythm and normal heart sounds.  Pulmonary/Chest: Effort normal and breath sounds normal.  Abdominal: Soft. Bowel sounds are normal.  Musculoskeletal: Normal range of motion.  Skin: Skin is warm and dry.        Assessment & Plan:   1. Well adult exam - CBC with Differential/Platelet; Future - CMP14+EGFR; Future - Lipid panel; Future   Continue all other maintenance medications as listed above.  Follow up plan: Recheck 1 year  Educational handout given for health maintenance  Terald Sleeper PA-C [facility] [address]  [place], [place] [address] [phone]   03/06/2018, 3:56 PM

## 2018-03-06 NOTE — Patient Instructions (Signed)

## 2018-09-24 ENCOUNTER — Ambulatory Visit: Payer: Self-pay

## 2018-09-24 ENCOUNTER — Other Ambulatory Visit: Payer: Self-pay | Admitting: Occupational Medicine

## 2018-09-24 DIAGNOSIS — Z0000 Encounter for general adult medical examination without abnormal findings: Secondary | ICD-10-CM

## 2019-04-10 IMAGING — DX DG CHEST 1V
1 series · 1 of 1 positions shown · non-contrast
Comparison: None.

CLINICAL DATA: Physical exam.

EXAM:
CHEST  1 VIEW

[chest pa]
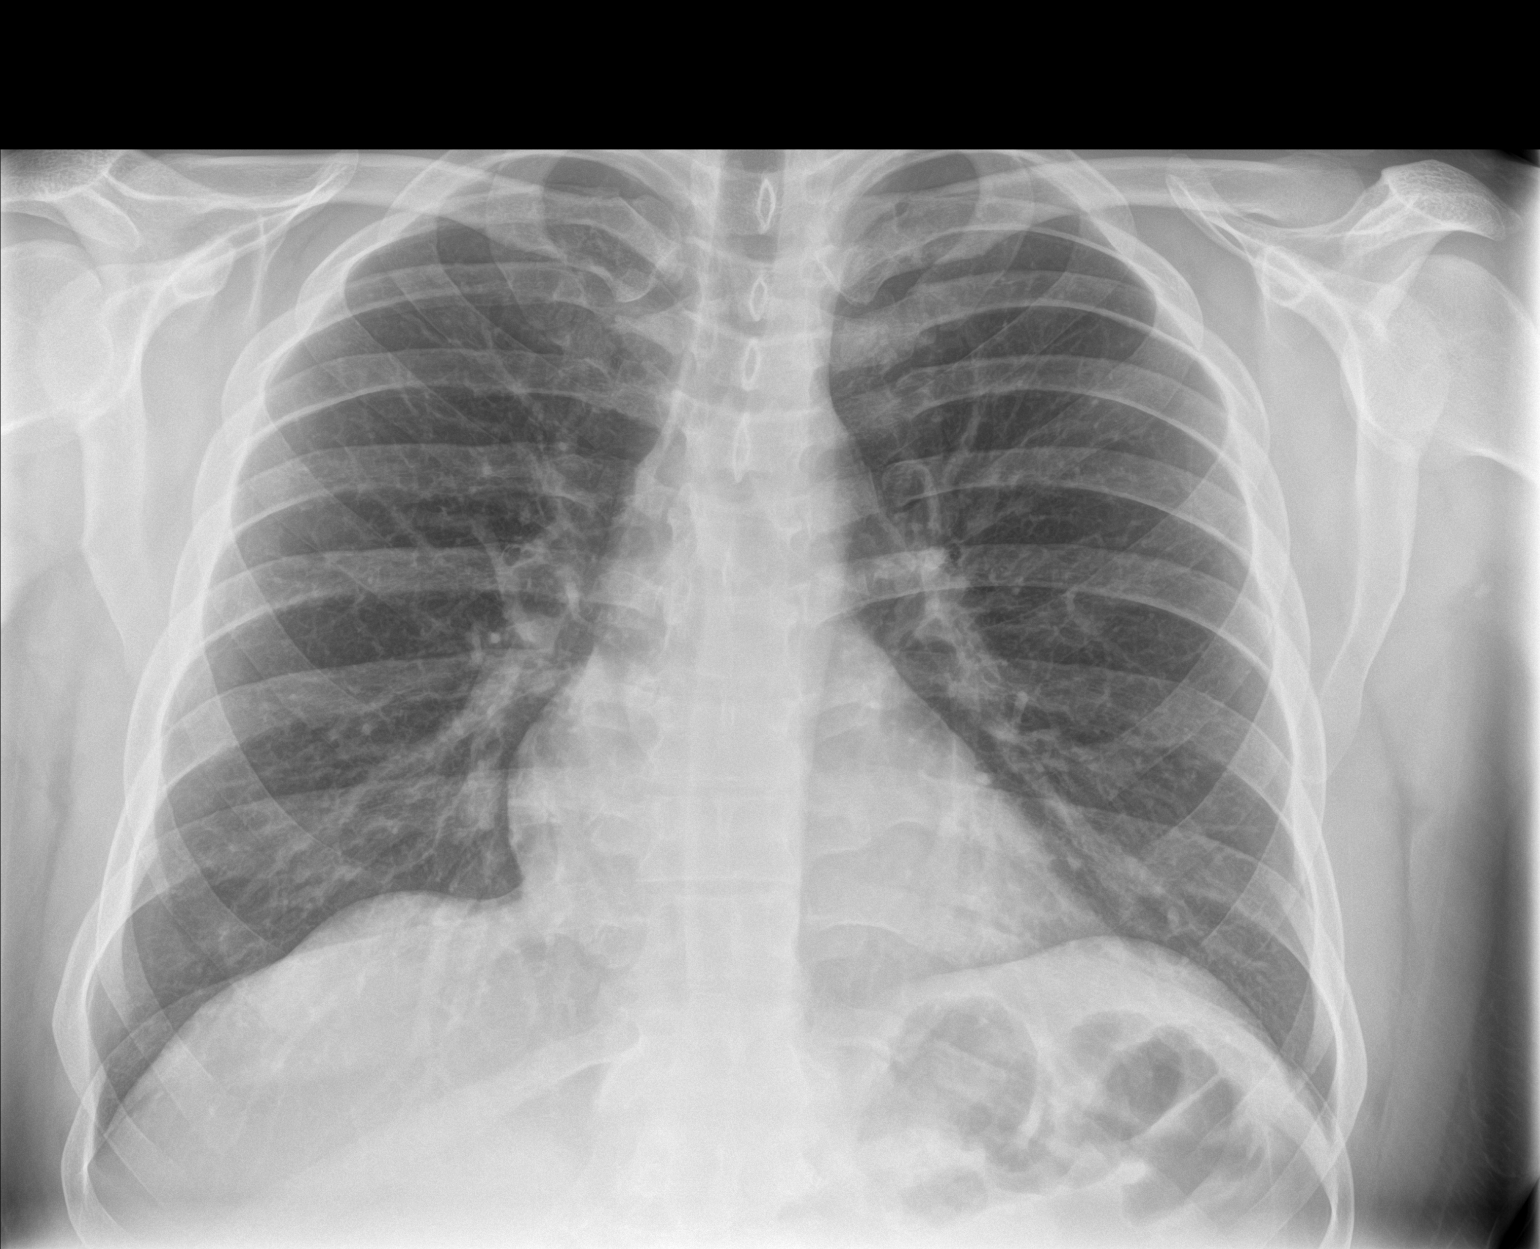

[1 of 1 positions shown; findings below may reference images not displayed]

FINDINGS: The heart size and mediastinal contours are within normal limits.
Both lungs are clear. The visualized skeletal structures are
unremarkable.
IMPRESSION: No active disease.

## 2019-05-26 ENCOUNTER — Ambulatory Visit (INDEPENDENT_AMBULATORY_CARE_PROVIDER_SITE_OTHER): Payer: BC Managed Care – PPO | Admitting: Urology

## 2019-05-26 DIAGNOSIS — Z302 Encounter for sterilization: Secondary | ICD-10-CM

## 2021-08-15 ENCOUNTER — Other Ambulatory Visit: Payer: Self-pay

## 2021-08-15 ENCOUNTER — Emergency Department (HOSPITAL_BASED_OUTPATIENT_CLINIC_OR_DEPARTMENT_OTHER)
Admission: EM | Admit: 2021-08-15 | Discharge: 2021-08-15 | Disposition: A | Discharge status: Home / Self Care | Payer: BC Managed Care – PPO | Attending: Emergency Medicine | Admitting: Emergency Medicine

## 2021-08-15 ENCOUNTER — Encounter (HOSPITAL_BASED_OUTPATIENT_CLINIC_OR_DEPARTMENT_OTHER): Payer: Self-pay

## 2021-08-15 ENCOUNTER — Emergency Department (HOSPITAL_BASED_OUTPATIENT_CLINIC_OR_DEPARTMENT_OTHER): Payer: BC Managed Care – PPO

## 2021-08-15 DIAGNOSIS — N451 Epididymitis: Secondary | ICD-10-CM | POA: Diagnosis not present

## 2021-08-15 DIAGNOSIS — N50812 Left testicular pain: Secondary | ICD-10-CM | POA: Diagnosis present

## 2021-08-15 LAB — URINALYSIS, MICROSCOPIC (REFLEX)

## 2021-08-15 LAB — URINALYSIS, ROUTINE W REFLEX MICROSCOPIC
Bilirubin Urine: NEGATIVE
Glucose, UA: NEGATIVE mg/dL
Ketones, ur: NEGATIVE mg/dL
Leukocytes,Ua: NEGATIVE
Nitrite: NEGATIVE
Protein, ur: NEGATIVE mg/dL
Specific Gravity, Urine: 1.03 (ref 1.005–1.030)
pH: 6 (ref 5.0–8.0)

## 2021-08-15 MED ORDER — CEFTRIAXONE SODIUM 500 MG IJ SOLR
500.0000 mg | Freq: Once | INTRAMUSCULAR | Status: AC
  Administered 2021-08-15: 500 mg via INTRAMUSCULAR
  Filled 2021-08-15: qty 500

## 2021-08-15 MED ORDER — DOXYCYCLINE HYCLATE 100 MG PO TABS
100.0000 mg | ORAL_TABLET | Freq: Once | ORAL | Status: AC
  Administered 2021-08-15: 100 mg via ORAL
  Filled 2021-08-15: qty 1

## 2021-08-15 MED ORDER — DOXYCYCLINE HYCLATE 100 MG PO CAPS: 100.0000 mg | ORAL_CAPSULE | Freq: Two times a day (BID) | ORAL | 0 refills | Status: AC

## 2021-08-15 NOTE — ED Provider Notes (Signed)
[facility] EMERGENCY DEPARTMENT Provider Note   CSN: [id number] Arrival date & time: 08/15/21  1534     History Chief Complaint  Patient presents with   Testicle Pain    Joel Bridges is a 30 y.o. male.  The history is provided by the patient.  Testicle Pain This is a new problem. Episode onset: 3-4 days. The problem occurs constantly. The problem has been gradually improving. Pertinent negatives include no abdominal pain. Associated symptoms comments: No dysuria, fever, abdominal pain, nausea or vomiting.  No trauma to the testicle.  Just noticed 3 to 4 days ago his of left testicle was swollen and tender.  The swelling has improved but he still has tenderness and it was causing him to worry.  He has had no change in his bowel movements.  No penile discharge.  Currently sexually active with only one partner who is his wife and they do not use protection.. Nothing aggravates the symptoms. Nothing relieves the symptoms. He has tried nothing for the symptoms. The treatment provided moderate relief.      History reviewed. No pertinent past medical history.  There are no problems to display for this patient.   History reviewed. No pertinent surgical history.     No family history on file.  Social History   Tobacco Use   Smoking status: Never   Smokeless tobacco: Never  Vaping Use   Vaping Use: Never used  Substance Use Topics   Alcohol use: No    Alcohol/week: 0.0 standard drinks   Drug use: No    Home Medications Prior to Admission medications   Medication Sig Start Date End Date Taking? Authorizing Provider  doxycycline (VIBRAMYCIN) 100 MG capsule Take 1 capsule (100 mg total) by mouth 2 (two) times daily. 08/15/21  Yes Gwyneth Sprout, MD    Allergies    Patient has no known allergies.  Review of Systems   Review of Systems  Gastrointestinal:  Negative for abdominal pain.  Genitourinary:  Positive for testicular pain.  All other systems reviewed  and are negative.  Physical Exam Updated Vital Signs BP 129/87 (BP Location: Right Arm)   Pulse 62   Temp 98.7 F (37.1 C) (Oral)   Resp 14   Ht 5\' 7"  (1.702 m)   Wt 104.3 kg   SpO2 97%   BMI 36.02 kg/m   Physical Exam Vitals and nursing note reviewed.  Constitutional:      General: He is not in acute distress.    Appearance: Normal appearance.  HENT:     Head: Normocephalic.  Eyes:     Pupils: Pupils are equal, round, and reactive to light.  Cardiovascular:     Rate and Rhythm: Normal rate.  Pulmonary:     Effort: Pulmonary effort is normal.  Abdominal:     General: Abdomen is flat.     Tenderness: There is no abdominal tenderness. There is no guarding or rebound.     Hernia: No hernia is present.     Comments: No notable inguinal hernias  Genitourinary:    Penis: Circumcised.      Testes:        Right: Mass, tenderness or swelling not present.        Left: Tenderness present.     Comments: Mild tenderness with palpation to the lateral portion of the testicle and mild firmness. Skin:    General: Skin is warm and dry.     Findings: No erythema or rash.  Neurological:  General: No focal deficit present.     Mental Status: He is alert and oriented to person, place, and time. Mental status is at baseline.  Psychiatric:        Mood and Affect: Mood normal.        Behavior: Behavior normal.    ED Results / Procedures / Treatments   Labs (all labs ordered are listed, but only abnormal results are displayed) Labs Reviewed  URINALYSIS, ROUTINE W REFLEX MICROSCOPIC - Abnormal; Notable for the following components:      Result Value   Hgb urine dipstick LARGE (*)    All other components within normal limits  URINALYSIS, MICROSCOPIC (REFLEX) - Abnormal; Notable for the following components:   Bacteria, UA RARE (*)    All other components within normal limits    EKG None  Radiology US Scrotum  Result Date: 08/15/2021 CLINICAL DATA:  Left-sided testicular  pain. EXAM: SCROTAL ULTRASOUND DOPPLER ULTRASOUND OF THE TESTICLES TECHNIQUE: Complete ultrasound examination of the testicles, epididymis, and other scrotal structures was performed. Color and spectral Doppler ultrasound were also utilized to evaluate blood flow to the testicles. COMPARISON:  None. FINDINGS: Right testicle Measurements: 4.8 cm x 2.1 cm x 3.0 cm. No mass or microlithiasis visualized. Left testicle Measurements: 4.6 cm x 1.7 cm x 3.2 cm. No mass or microlithiasis visualized. Right epididymis: The right epididymis is heterogeneous in appearance and demonstrates increased vascularity within epididymal tail. Left epididymis: The left epididymis is heterogeneous in appearance and demonstrates increased vascularity within the epididymal tail. This is increased in severity when compared to the right epididymis. Hydrocele:  Bilateral hydroceles are seen, right larger than left. Varicocele:  None visualized. Pulsed Doppler interrogation of both testes demonstrates normal low resistance arterial and venous waveforms bilaterally. IMPRESSION: 1. Findings consistent with bilateral epididymitis, left greater than right. 2. Bilateral hydroceles, right greater than left. Electronically Signed   By: Aram Candela M.D.   On: 08/15/2021 17:45    Procedures Procedures   Medications Ordered in ED Medications  cefTRIAXone (ROCEPHIN) injection 500 mg (has no administration in time range)  doxycycline (VIBRA-TABS) tablet 100 mg (has no administration in time range)    ED Course  I have reviewed the triage vital signs and the nursing notes.  Pertinent labs & imaging results that were available during my care of the patient were reviewed by me and considered in my medical decision making (see chart for details).    MDM Rules/Calculators/A&P                           Patient presenting today with left testicle pain that is now been present for 3 to 4 days.  Initially had swelling which is gradually  improving.  No prior history of similar symptoms.  He has no other associated symptoms.  He has no trauma.  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  The testicle is tender to palpation and some mild firmness on the lateral side.  No evidence of scrotal cellulitis.  Low suspicion for STI as patient is sexually active with only his wife for quite some time.  No recent diarrhea illness or difficulty having bowel movements.  Ultrasound to further evaluate is pending.  6:34 PM Patient's urine is within normal limits.  Ultrasound shows bilateral epididymitis and bilateral hydroceles.  Left greater than right.  Patient given Rocephin and doxycycline  to cover for bacterial cause.  Also given urology follow-up if symptoms or not improving.  MDM   Amount and/or Complexity of Data Reviewed Clinical lab tests: ordered and reviewed Tests in the radiology section of CPT: ordered and reviewed Independent visualization of images, tracings, or specimens: yes     Final Clinical Impression(s) / ED Diagnoses Final diagnoses:  Epididymitis, bilateral    Rx / DC Orders ED Discharge Orders          Ordered    doxycycline (VIBRAMYCIN) 100 MG capsule  2 times daily        08/15/21 1832             Gwyneth Sprout, MD 08/15/21 1835

## 2021-08-15 NOTE — ED Triage Notes (Addendum)
Pt c/o swelling/pain to left testicle x 3 days-denies injury-NAD-steady gait

## 2021-08-15 NOTE — ED Notes (Signed)
Pt instructed to wait 5-10 min to evaluate for reaction

## 2022-03-01 IMAGING — US US SCROTUM
1 series · 13 of 25 positions shown · non-contrast
Comparison: None.

CLINICAL DATA: Left-sided testicular pain.

EXAM:
SCROTAL ULTRASOUND
DOPPLER ULTRASOUND OF THE TESTICLES
TECHNIQUE: Complete ultrasound examination of the testicles, epididymis, and
other scrotal structures was performed. Color and spectral Doppler
ultrasound were also utilized to evaluate blood flow to the
testicles.

[Series 1: us scrotum · 13 of 61 slices shown]
[im 1/61]
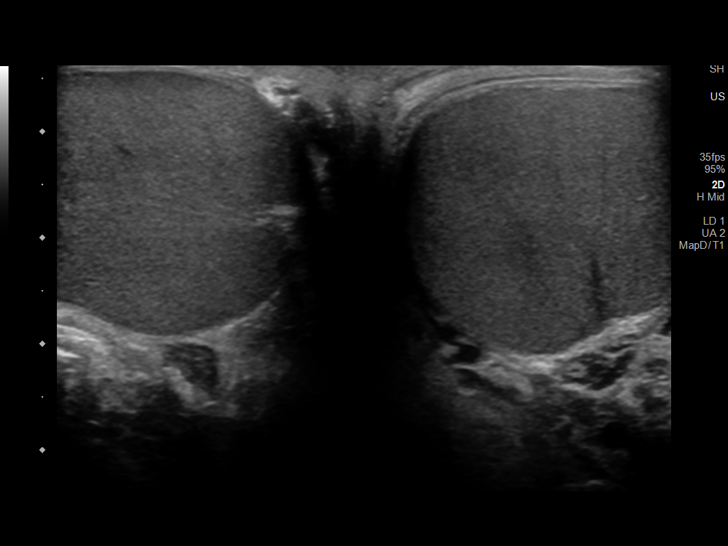
[im 6/61]
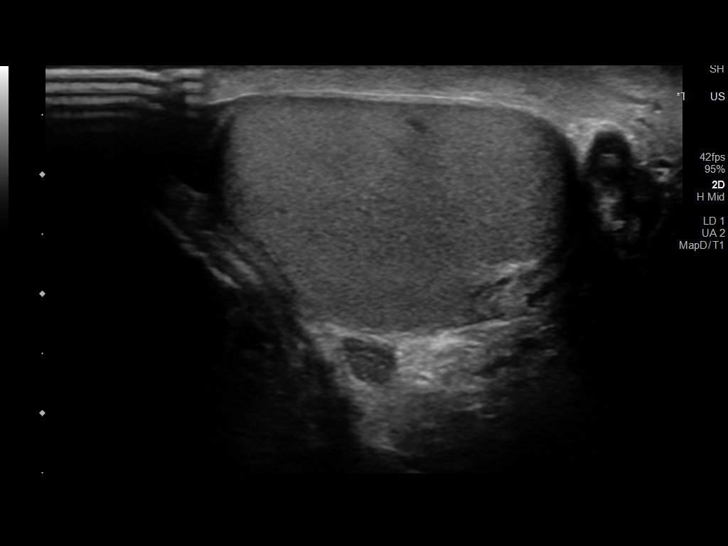
[im 11/61]
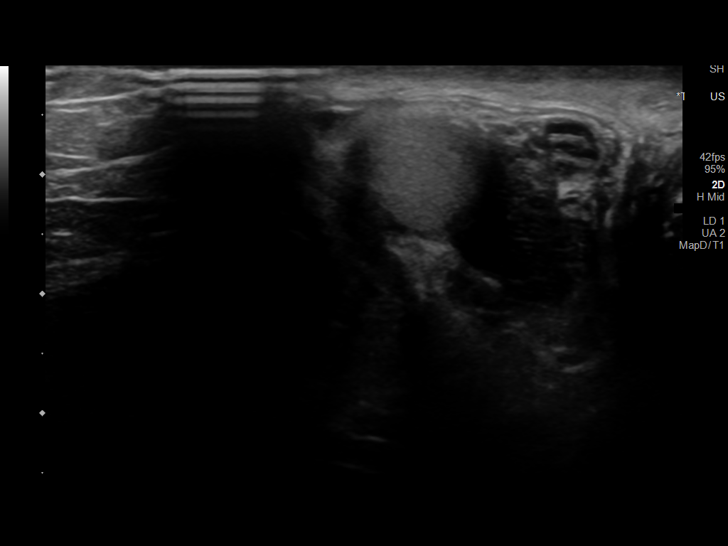
[im 16/61]
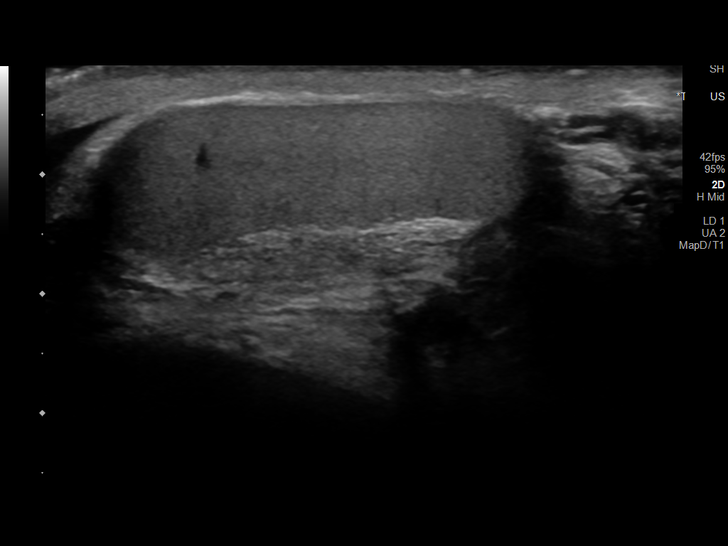
[im 21/61]
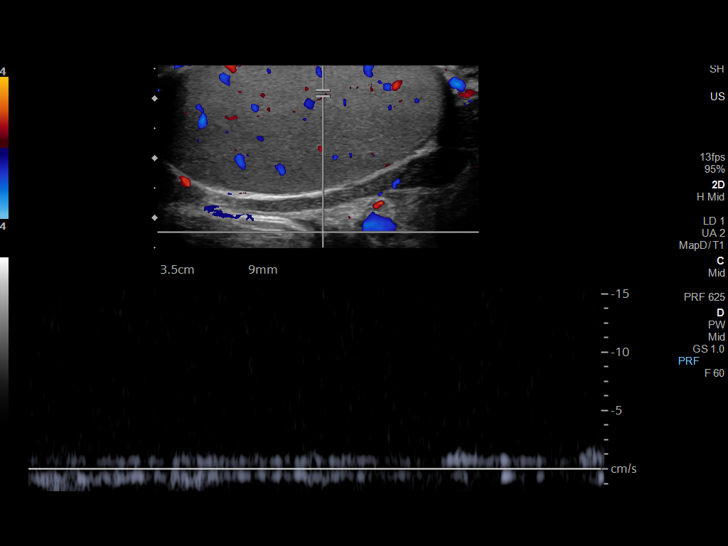
[im 26/61]
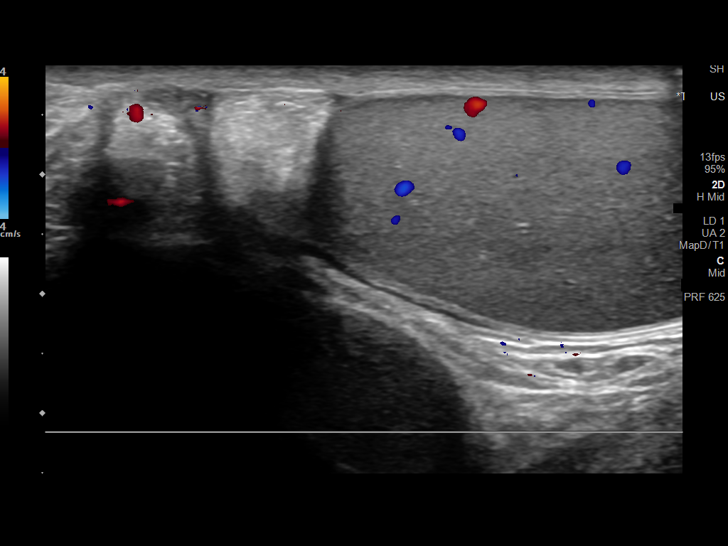
[im 31/61]
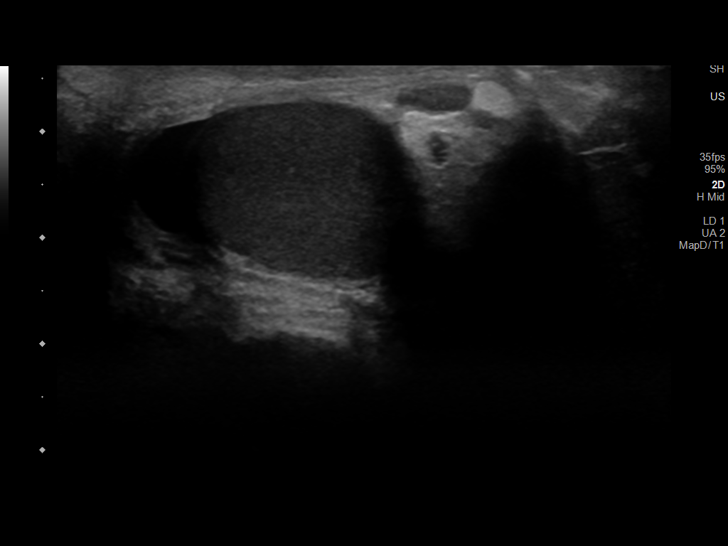
[im 36/61]
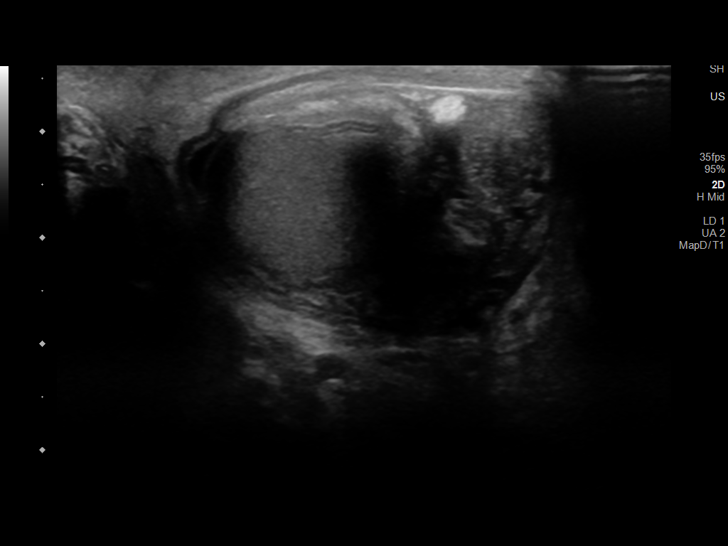
[im 41/61]
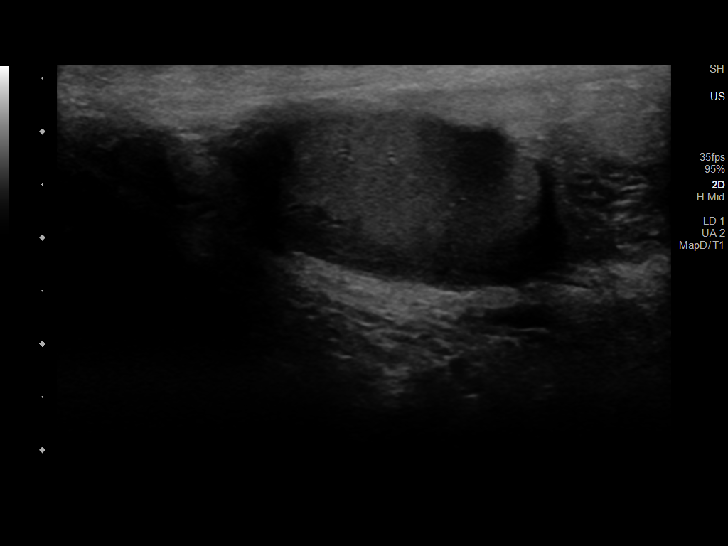
[im 46/61]
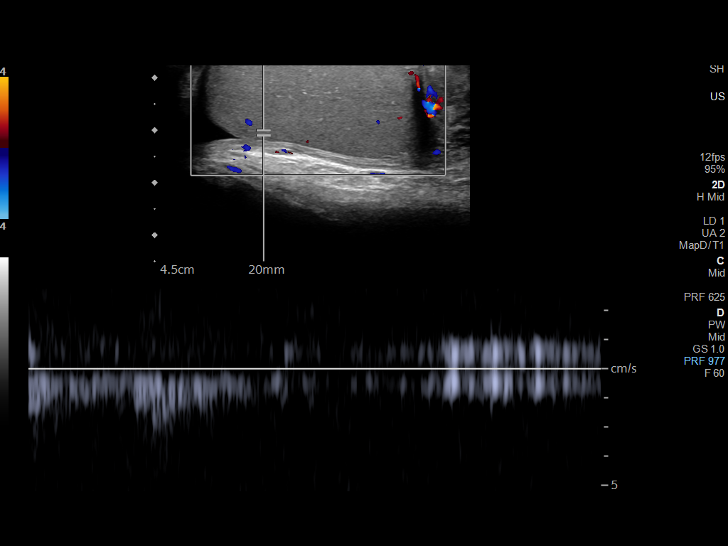
[im 51/61]
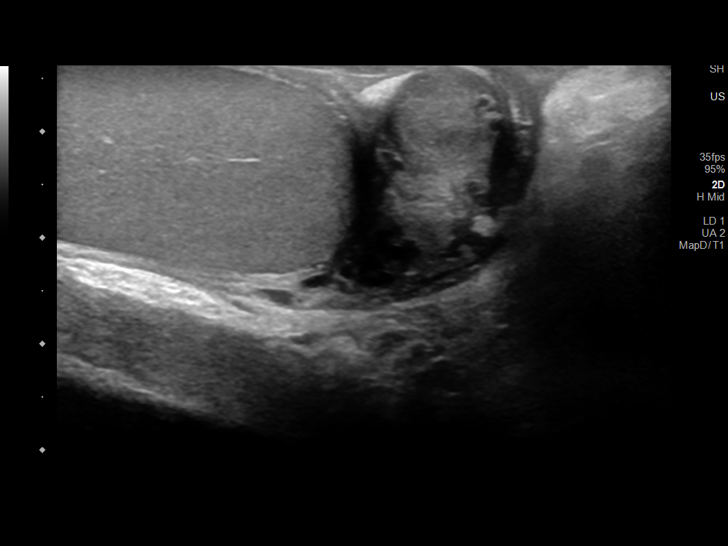
[im 56/61]
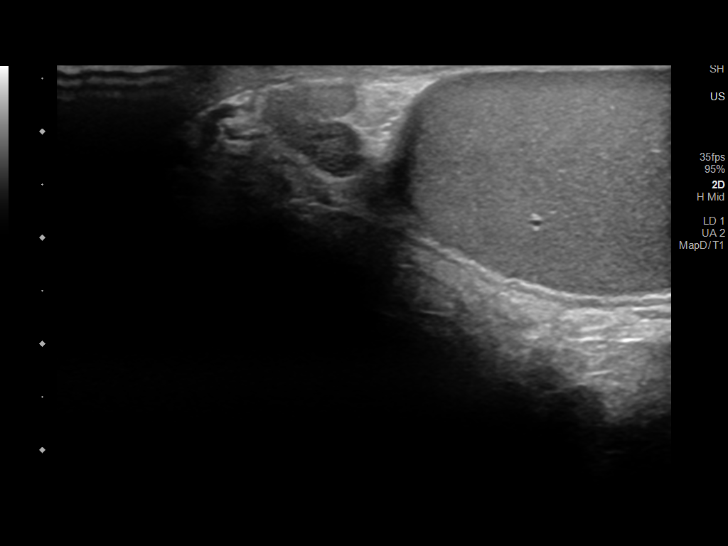
[im 61/61]
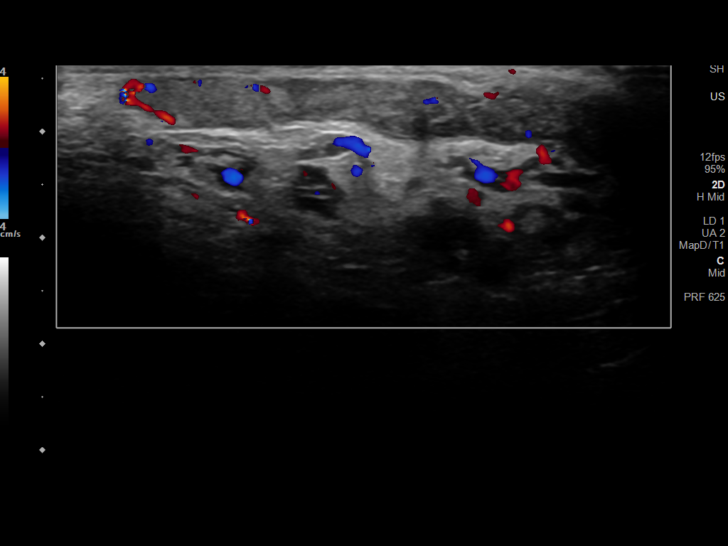

[13 of 25 positions shown; findings below may reference images not displayed]

FINDINGS: Right testicle

Measurements: 4.8 cm x 2.1 cm x 3.0 cm. No mass or microlithiasis
visualized.

Left testicle

Measurements: 4.6 cm x 1.7 cm x 3.2 cm. No mass or microlithiasis
visualized.

Right epididymis: The right epididymis is heterogeneous in
appearance and demonstrates increased vascularity within epididymal
tail.

Left epididymis: The left epididymis is heterogeneous in appearance
and demonstrates increased vascularity within the epididymal tail.
This is increased in severity when compared to the right epididymis.

Hydrocele:  Bilateral hydroceles are seen, right larger than left.

Varicocele:  None visualized.

Pulsed Doppler interrogation of both testes demonstrates normal low
resistance arterial and venous waveforms bilaterally.
IMPRESSION: 1. Findings consistent with bilateral epididymitis, left greater
than right.
2. Bilateral hydroceles, right greater than left.

## 2022-12-26 DIAGNOSIS — Z419 Encounter for procedure for purposes other than remedying health state, unspecified: Secondary | ICD-10-CM | POA: Diagnosis not present

## 2023-01-24 DIAGNOSIS — Z419 Encounter for procedure for purposes other than remedying health state, unspecified: Secondary | ICD-10-CM | POA: Diagnosis not present

## 2023-02-24 DIAGNOSIS — Z419 Encounter for procedure for purposes other than remedying health state, unspecified: Secondary | ICD-10-CM | POA: Diagnosis not present

## 2023-03-26 DIAGNOSIS — Z419 Encounter for procedure for purposes other than remedying health state, unspecified: Secondary | ICD-10-CM | POA: Diagnosis not present

## 2023-04-26 DIAGNOSIS — Z419 Encounter for procedure for purposes other than remedying health state, unspecified: Secondary | ICD-10-CM | POA: Diagnosis not present

## 2023-05-26 DIAGNOSIS — Z419 Encounter for procedure for purposes other than remedying health state, unspecified: Secondary | ICD-10-CM | POA: Diagnosis not present

## 2023-06-26 DIAGNOSIS — Z419 Encounter for procedure for purposes other than remedying health state, unspecified: Secondary | ICD-10-CM | POA: Diagnosis not present

## 2023-07-27 DIAGNOSIS — Z419 Encounter for procedure for purposes other than remedying health state, unspecified: Secondary | ICD-10-CM | POA: Diagnosis not present

## 2023-09-26 DIAGNOSIS — Z419 Encounter for procedure for purposes other than remedying health state, unspecified: Secondary | ICD-10-CM | POA: Diagnosis not present

## 2023-10-26 DIAGNOSIS — Z419 Encounter for procedure for purposes other than remedying health state, unspecified: Secondary | ICD-10-CM | POA: Diagnosis not present

## 2023-11-26 DIAGNOSIS — Z419 Encounter for procedure for purposes other than remedying health state, unspecified: Secondary | ICD-10-CM | POA: Diagnosis not present

## 2023-12-27 DIAGNOSIS — Z419 Encounter for procedure for purposes other than remedying health state, unspecified: Secondary | ICD-10-CM | POA: Diagnosis not present

## 2024-01-24 DIAGNOSIS — Z419 Encounter for procedure for purposes other than remedying health state, unspecified: Secondary | ICD-10-CM | POA: Diagnosis not present

## 2024-03-06 DIAGNOSIS — Z419 Encounter for procedure for purposes other than remedying health state, unspecified: Secondary | ICD-10-CM | POA: Diagnosis not present

## 2024-04-05 DIAGNOSIS — Z419 Encounter for procedure for purposes other than remedying health state, unspecified: Secondary | ICD-10-CM | POA: Diagnosis not present

## 2024-05-06 DIAGNOSIS — Z419 Encounter for procedure for purposes other than remedying health state, unspecified: Secondary | ICD-10-CM | POA: Diagnosis not present

## 2024-06-05 DIAGNOSIS — Z419 Encounter for procedure for purposes other than remedying health state, unspecified: Secondary | ICD-10-CM | POA: Diagnosis not present

## 2024-07-06 DIAGNOSIS — Z419 Encounter for procedure for purposes other than remedying health state, unspecified: Secondary | ICD-10-CM | POA: Diagnosis not present

## 2024-08-06 DIAGNOSIS — Z419 Encounter for procedure for purposes other than remedying health state, unspecified: Secondary | ICD-10-CM | POA: Diagnosis not present
